# Patient Record
Sex: Male | Born: 1990 | Race: Black or African American | Hispanic: No | Marital: Single | Smoking: Never smoker
Health system: Southern US, Community
[De-identification: ages and names within clinical notes are randomized; demographics above are authoritative.]

---

## 2020-08-04 ENCOUNTER — Encounter (HOSPITAL_COMMUNITY): Payer: Self-pay

## 2020-08-04 ENCOUNTER — Other Ambulatory Visit: Payer: Self-pay

## 2020-08-04 ENCOUNTER — Emergency Department (HOSPITAL_COMMUNITY): Payer: Medicaid Other

## 2020-08-04 ENCOUNTER — Emergency Department (HOSPITAL_COMMUNITY)
Admission: EM | Admit: 2020-08-04 | Discharge: 2020-08-04 | Disposition: A | Discharge status: Home / Self Care | Payer: Medicaid Other | Attending: Emergency Medicine | Admitting: Emergency Medicine

## 2020-08-04 DIAGNOSIS — M25561 Pain in right knee: Secondary | ICD-10-CM | POA: Insufficient documentation

## 2020-08-04 DIAGNOSIS — Y999 Unspecified external cause status: Secondary | ICD-10-CM | POA: Diagnosis not present

## 2020-08-04 DIAGNOSIS — Y92310 Basketball court as the place of occurrence of the external cause: Secondary | ICD-10-CM | POA: Insufficient documentation

## 2020-08-04 DIAGNOSIS — W2105XA Struck by basketball, initial encounter: Secondary | ICD-10-CM | POA: Insufficient documentation

## 2020-08-04 DIAGNOSIS — Y9367 Activity, basketball: Secondary | ICD-10-CM | POA: Diagnosis not present

## 2020-08-04 MED ORDER — HYDROCODONE-ACETAMINOPHEN 5-325 MG PO TABS
1.0000 | ORAL_TABLET | Freq: Once | ORAL | Status: AC
  Administered 2020-08-04: 1 via ORAL
  Filled 2020-08-04: qty 1

## 2020-08-04 MED ORDER — HYDROCODONE-ACETAMINOPHEN 5-325 MG PO TABS: 1.0000 | ORAL_TABLET | ORAL | 0 refills | Status: DC | PRN

## 2020-08-04 NOTE — ED Provider Notes (Signed)
Antonio Hurst Provider Note   CSN: [id number] Arrival date & time: 08/04/20  2147    History Chief Complaint  Patient presents with  . Knee Pain    Antonio Hurst is a 29 y.o. male with this a past medical history who presents for evaluation of right knee pain.  Patient states he was playing basketball when he twisted at his right knee.  Patient states he felt a pop sensation and pain to his right lateral knee.  Does not extend into tibia, fibula, femur.  Has been unable to walk since the pain.  Has not take anything for this.  Rates his pain a 10/10.  Has noticed some mild swelling to the right aspect of knee.  No overlying erythema or warmth.  Denies falling lower extremity.  No paresthesias.  Denies fever, chills, nausea, vomiting, chest pain, shortness of breath abdominal pain, diarrhea, dysuria.  Denies aggravating or relieving factors.  History obtained from patient and past medical records. No interpretor was used.  Not currently followed by Ortho  HPI     History reviewed. No pertinent past medical history.  There are no problems to display for this patient.   History reviewed     History reviewed. No pertinent family history.  Social History   Tobacco Use  . Smoking status: Not on file  Substance Use Topics  . Alcohol use: Not on file  . Drug use: Not on file    Home Medications Prior to Admission medications   Medication Sig Start Date End Date Taking? Authorizing Provider  HYDROcodone-acetaminophen (NORCO/VICODIN) 5-325 MG tablet Take 1 tablet by mouth every 4 (four) hours as needed. 08/04/20   Antonio Hurst A, PA-C    Allergies    Patient has no known allergies.  Review of Systems   Review of Systems  Constitutional: Negative.   HENT: Negative.   Respiratory: Negative.   Cardiovascular: Negative.   Gastrointestinal: Negative.   Genitourinary: Negative.   Musculoskeletal: Positive for gait problem. Negative for  arthralgias, back pain, joint swelling, myalgias, neck pain and neck stiffness.       Right knee pain  Skin: Negative.   Neurological: Negative for dizziness, weakness, light-headedness and numbness.  All other systems reviewed and are negative.  Physical Exam Updated Vital Signs BP 131/79   Pulse 85   Temp 99.5 F (37.5 C) (Oral)   Resp 16   SpO2 98%   Physical Exam Vitals and nursing note reviewed.  Constitutional:      General: He is not in acute distress.    Appearance: He is well-developed. He is not ill-appearing, toxic-appearing or diaphoretic.  HENT:     Head: Atraumatic.  Eyes:     Pupils: Pupils are equal, round, and reactive to light.  Cardiovascular:     Rate and Rhythm: Normal rate and regular rhythm.  Pulmonary:     Effort: Pulmonary effort is normal. No respiratory distress.  Abdominal:     General: There is no distension.     Palpations: Abdomen is soft.  Musculoskeletal:     Cervical back: Normal range of motion and neck supple.     Thoracic back: Normal.     Lumbar back: Normal.     Right hip: Normal.     Left hip: Normal.     Right upper leg: Normal.     Left upper leg: Normal.     Right knee: Decreased range of motion. Tenderness present over the lateral joint line  and LCL. No patellar tendon tenderness. LCL laxity present.     Left knee: Normal.     Right lower leg: Normal.     Left lower leg: Normal.     Right ankle: Normal.     Left ankle: Normal.       Legs:     Comments: Tenderness palpation to right lateral knee over lateral joint line.  Is able to flex and extend however with pain.  Has mild joint effusion to right knee.  No bony tenderness to femur, tibia or fibula.  Wiggles toes without difficulty.  No shortening or rotation of legs.  Skin:    General: Skin is warm and dry.     Capillary Refill: Capillary refill takes less than 2 seconds.     Comments: No erythema, warmth.  No fluctuance or induration.  No overlying erythema or warmth    Neurological:     General: No focal deficit present.     Mental Status: He is alert.     Sensory: Sensation is intact.     Motor: Motor function is intact.     Coordination: Coordination is intact.     Comments: Unable to ambulate secondary to right knee pain.    ED Results / Procedures / Treatments   Labs (all labs ordered are listed, but only abnormal results are displayed) Labs Reviewed - No data to display  EKG None  Radiology DG Knee Complete 4 Views Right  Result Date: 08/04/2020 CLINICAL DATA:  Status post trauma. EXAM: RIGHT KNEE - COMPLETE 4+ VIEW COMPARISON:  None. FINDINGS: No evidence of acute fracture or dislocation. No evidence of arthropathy or other focal bone abnormality. A small to moderate sized joint effusion is noted. IMPRESSION: 1. No acute fracture or dislocation. 2. Small to moderate sized joint effusion. Electronically Signed   By: Aram Candela M.D.   On: 08/04/2020 22:31   Procedures Procedures (including critical care time)  Medications Ordered in ED Medications  HYDROcodone-acetaminophen (NORCO/VICODIN) 5-325 MG per tablet 1 tablet (1 tablet Oral Given 08/04/20 2257)   ED Course  I have reviewed the triage vital signs and the nursing notes.  Pertinent labs & imaging results that were available during my care of the patient were reviewed by me and considered in my medical decision making (see chart for details).  29 year old presents for evaluation of right knee pain. Twisted right knee pain felt a pop to the right lateral aspect of his knee.  Has been able to ambulate since then.  Has some tenderness to his lateral joint line with some mild swelling.  No bony tenderness of bilateral femur, tibia or fibula.  He is neurovascularly intact.  X-ray with small joint effusion however no fracture, dislocation.  Suspect ligament injury.  Discussed brace, crutches, RICE for symptomatic management and follow-up outpatient with orthopedics.  Low suspicion for  septic joint, gout, hemarthrosis, acute fracture, dislocation.   The patient has been appropriately medically screened and/or stabilized in the ED. I have low suspicion for any other emergent medical condition which would require further screening, evaluation or treatment in the ED or require inpatient management.  Patient is hemodynamically stable and in no acute distress.  Patient able to ambulate in department prior to ED.  Evaluation does not show acute pathology that would require ongoing or additional emergent interventions while in the emergency department or further inpatient treatment.  I have discussed the diagnosis with the patient and answered all questions.  Pain is been managed  while in the emergency department and patient has no further complaints prior to discharge.  Patient is comfortable with plan discussed in room and is stable for discharge at this time.  I have discussed strict return precautions for returning to the emergency department.  Patient was encouraged to follow-up with PCP/specialist refer to at discharge.    MDM Rules/Calculators/A&P                           Final Clinical Impression(s) / ED Diagnoses Final diagnoses:  Acute pain of right knee    Rx / DC Orders ED Discharge Orders         Ordered    HYDROcodone-acetaminophen (NORCO/VICODIN) 5-325 MG tablet  Every 4 hours PRN     Discontinue  Reprint     08/04/20 2325           Alexiah Koroma A, PA-C 08/04/20 2344    Alvira Monday, MD 08/05/20 1612

## 2020-08-04 NOTE — Discharge Instructions (Signed)
Follow-up with orthopedics.  I have prescribed you pain medicine to a 24-hour pharmacy that is local.  Take as needed for knee pain.  Do not drive or operate heavy machinery while taking this medication as this may make you sleepy.  This medication may be, addictive.  May also take ibuprofen.  Sure to ice and elevate your knee.  Return for new or worsening symptoms.

## 2020-08-04 NOTE — ED Triage Notes (Signed)
Pt reports feeling a pop in his R lateral knee around 730p when playing basketball. No fall. States that he cant move that knee or bear weight. Pain 10/10.

## 2020-08-18 ENCOUNTER — Ambulatory Visit: Payer: Medicaid Other | Admitting: Physician Assistant

## 2021-03-13 ENCOUNTER — Emergency Department (HOSPITAL_COMMUNITY): Payer: Medicaid Other

## 2021-03-13 ENCOUNTER — Emergency Department (HOSPITAL_COMMUNITY)
Admission: EM | Admit: 2021-03-13 | Discharge: 2021-03-13 | Disposition: A | Discharge status: Home / Self Care | Payer: Medicaid Other | Attending: Emergency Medicine | Admitting: Emergency Medicine

## 2021-03-13 ENCOUNTER — Encounter (HOSPITAL_COMMUNITY): Payer: Self-pay | Admitting: Emergency Medicine

## 2021-03-13 DIAGNOSIS — R102 Pelvic and perineal pain: Secondary | ICD-10-CM | POA: Diagnosis present

## 2021-03-13 DIAGNOSIS — N200 Calculus of kidney: Secondary | ICD-10-CM | POA: Insufficient documentation

## 2021-03-13 LAB — CBC WITH DIFFERENTIAL/PLATELET
Abs Immature Granulocytes: 0.01 10*3/uL (ref 0.00–0.07)
Basophils Absolute: 0.1 10*3/uL (ref 0.0–0.1)
Basophils Relative: 1 %
Eosinophils Absolute: 0.2 10*3/uL (ref 0.0–0.5)
Eosinophils Relative: 3 %
HCT: 43.4 % (ref 39.0–52.0)
Hemoglobin: 14.2 g/dL (ref 13.0–17.0)
Immature Granulocytes: 0 %
Lymphocytes Relative: 37 %
Lymphs Abs: 2.7 10*3/uL (ref 0.7–4.0)
MCH: 27 pg (ref 26.0–34.0)
MCHC: 32.7 g/dL (ref 30.0–36.0)
MCV: 82.7 fL (ref 80.0–100.0)
Monocytes Absolute: 0.5 10*3/uL (ref 0.1–1.0)
Monocytes Relative: 7 %
Neutro Abs: 3.8 10*3/uL (ref 1.7–7.7)
Neutrophils Relative %: 52 %
Platelets: 317 10*3/uL (ref 150–400)
RBC: 5.25 MIL/uL (ref 4.22–5.81)
RDW: 13.6 % (ref 11.5–15.5)
WBC: 7.3 10*3/uL (ref 4.0–10.5)
nRBC: 0 % (ref 0.0–0.2)

## 2021-03-13 LAB — COMPREHENSIVE METABOLIC PANEL
ALT: 43 U/L (ref 0–44)
AST: 30 U/L (ref 15–41)
Albumin: 3.7 g/dL (ref 3.5–5.0)
Alkaline Phosphatase: 90 U/L (ref 38–126)
Anion gap: 5 (ref 5–15)
BUN: 12 mg/dL (ref 6–20)
CO2: 25 mmol/L (ref 22–32)
Calcium: 9.3 mg/dL (ref 8.9–10.3)
Chloride: 111 mmol/L (ref 98–111)
Creatinine, Ser: 1.28 mg/dL — ABNORMAL HIGH (ref 0.61–1.24)
GFR, Estimated: >60 mL/min (ref >60)
Glucose, Bld: 101 mg/dL — ABNORMAL HIGH (ref 70–99)
Potassium: 4.2 mmol/L (ref 3.5–5.1)
Sodium: 141 mmol/L (ref 135–145)
Total Bilirubin: 0.5 mg/dL (ref 0.3–1.2)
Total Protein: 6.7 g/dL (ref 6.5–8.1)

## 2021-03-13 LAB — URINALYSIS, ROUTINE W REFLEX MICROSCOPIC
Bacteria, UA: NONE SEEN
Bilirubin Urine: NEGATIVE
Glucose, UA: NEGATIVE mg/dL
Ketones, ur: NEGATIVE mg/dL
Leukocytes,Ua: NEGATIVE
Nitrite: NEGATIVE
Protein, ur: NEGATIVE mg/dL
RBC / HPF: >50 RBC/hpf — ABNORMAL HIGH (ref 0–5)
Specific Gravity, Urine: 1.01 (ref 1.005–1.030)
pH: 6 (ref 5.0–8.0)

## 2021-03-13 MED ORDER — NAPROXEN 375 MG PO TABS: 375.0000 mg | ORAL_TABLET | Freq: Two times a day (BID) | ORAL | 0 refills | Status: AC | PRN

## 2021-03-13 NOTE — Discharge Instructions (Addendum)
-  Your kidney function was slightly elevated today.  Normal is 1 and yours is 1.28.  You will need to follow-up with your primary care doctor to have this rechecked in 1 week with a blood test.  It is important that you stay well-hydrated and increase your fluid intake to help get your kidney function back to normal.  -CT scan shows you have a kidney stone in your right kidney that should not be the cause of your pain.  The stone that was seen in the bladder is likely the one that was causing your pain.  It should pass in the next couple of days.  -Prescription sent to pharmacy for naproxen.  This is an anti-inflammatory medicine.  Take it with food so it does not upset your stomach.  Do not take any additional Aleve, Motrin, ibuprofen for the same time as this medicine from the same family.  You can continue to take Tylenol for pain.  Follow-up with community clinic as needed.

## 2021-03-13 NOTE — ED Triage Notes (Signed)
Reports R sided pelvic pain that radiates to back since yesterday.  Denies nausea, vomiting, diarrhea, and urinary complaints.  No change with movement.

## 2021-03-13 NOTE — ED Provider Notes (Signed)
[name] EMERGENCY DEPARTMENT Provider Note   CSN: [id number] Arrival date & time: 03/13/21  1543     History Chief Complaint  Patient presents with   Pelvic Pain   Back Pain    Antonio Hurst is a 30 y.o. male with past medical history of kidney stones.  HPI Patient presents to emergency room today with chief complaint of right-sided pelvic and back pain x1 day.  He states the pain started last night.  It woke him up several times throughout the night.  He describes as an aching pain.  It started in his back and radiates to his groin.  He has pain in his right testicle as well.  He states the pain has been constant, waxes and wanes in severity.  He rates the pain currently 6 out of 10 in severity however it has been as high as 10 out of 10.  He denies any associated nausea or emesis.  He took Tylenol with minimal symptom improvement.  He denies any fever, chills, gross hematuria, urinary frequency, penile discharge, penile or scrotal swelling.  He is not concerned for STIs.  He states he had a kidney stone several years ago and this feels similar.  Denies any recent fall, trauma or injury to his back.    History reviewed. No pertinent past medical history.  There are no problems to display for this patient.   History reviewed. No pertinent surgical history.     No family history on file.  Social History   Tobacco Use   Smoking status: Never Smoker   Smokeless tobacco: Never Used  Substance Use Topics   Alcohol use: Not Currently   Drug use: Not Currently    Home Medications Prior to Admission medications   Medication Sig Start Date End Date Taking? Authorizing Provider  naproxen (NAPROSYN) 375 MG tablet Take 1 tablet (375 mg total) by mouth 2 (two) times daily as needed for up to 7 days for mild pain. 03/13/21 03/20/21 Yes Walisiewicz, Kiko Ripp E, PA-C  HYDROcodone-acetaminophen (NORCO/VICODIN) 5-325 MG tablet Take 1 tablet by mouth every 4 (four)  hours as needed. 08/04/20   Henderly, Britni A, PA-C    Allergies    Patient has no known allergies.  Review of Systems   Review of Systems All other systems are reviewed and are negative for acute change except as noted in the HPI.  Physical Exam Updated Vital Signs BP 128/66 (BP Location: Left Arm)    Pulse 75    Temp 98.6 F (37 C)    Resp 18    SpO2 97%   Physical Exam Vitals and nursing note reviewed.  Constitutional:      General: He is not in acute distress.    Appearance: He is not ill-appearing.  HENT:     Head: Normocephalic and atraumatic.     Right Ear: External ear normal.     Left Ear: External ear normal.     Nose: Nose normal.     Mouth/Throat:     Mouth: Mucous membranes are moist.     Pharynx: Oropharynx is clear.  Eyes:     General: No scleral icterus.       Right eye: No discharge.        Left eye: No discharge.     Extraocular Movements: Extraocular movements intact.     Conjunctiva/sclera: Conjunctivae normal.     Pupils: Pupils are equal, round, and reactive to light.  Neck:  Vascular: No JVD.  Cardiovascular:     Rate and Rhythm: Normal rate and regular rhythm.     Pulses: Normal pulses.          Radial pulses are 2+ on the right side and 2+ on the left side.     Heart sounds: Normal heart sounds.  Pulmonary:     Comments: Lungs clear to auscultation in all fields. Symmetric chest rise. No wheezing, rales, or rhonchi. Abdominal:     Comments: Abdomen is soft, non-distended.  Tender to palpation of right flank.  No peritoneal signs.  No abdominal tenderness appreciated. No rigidity, no guarding. No peritoneal signs.  Genitourinary:    Comments: Chaperone present for exam. No discharge or urethritis noted. No signs of sores or lesions or erythema on the penis or testicles. The penis and testicles are nontender. No testicular masses or swelling. No scrotal swelling. No signs of any inguinal hernias. Cremaster reflex present  bilaterally.   Musculoskeletal:        General: Normal range of motion.     Cervical back: Normal range of motion.  Skin:    General: Skin is warm and dry.     Capillary Refill: Capillary refill takes less than 2 seconds.  Neurological:     Mental Status: He is oriented to person, place, and time.     GCS: GCS eye subscore is 4. GCS verbal subscore is 5. GCS motor subscore is 6.     Comments: Fluent speech, no facial droop.  Psychiatric:        Behavior: Behavior normal.     ED Results / Procedures / Treatments   Labs (all labs ordered are listed, but only abnormal results are displayed) Labs Reviewed  URINALYSIS, ROUTINE W REFLEX MICROSCOPIC - Abnormal; Notable for the following components:      Result Value   Color, Urine STRAW (*)    Hgb urine dipstick LARGE (*)    RBC / HPF >50 (*)    All other components within normal limits  COMPREHENSIVE METABOLIC PANEL - Abnormal; Notable for the following components:   Glucose, Bld 101 (*)    Creatinine, Ser 1.28 (*)    All other components within normal limits  CBC WITH DIFFERENTIAL/PLATELET    EKG None  Radiology CT Renal Stone Study  Result Date: 03/13/2021 CLINICAL DATA:  Flank pain.  Evaluate for kidney stone. EXAM: CT ABDOMEN AND PELVIS WITHOUT CONTRAST TECHNIQUE: Multidetector CT imaging of the abdomen and pelvis was performed following the standard protocol without IV contrast. COMPARISON:  None. FINDINGS: Lower chest: No acute abnormality. Hepatobiliary: No focal liver abnormality is seen. No gallstones, gallbladder wall thickening, or biliary dilatation. Pancreas: Unremarkable. No pancreatic ductal dilatation or surrounding inflammatory changes. Spleen: Normal in size without focal abnormality. Adrenals/Urinary Tract: Normal adrenal glands. Mild punctate stone within upper pole of right kidney is identified, image 73/6. No left renal calculi. Mild right hydroureter. No right ureteral calculi. Punctate stone is identified  within the posterior bladder base measuring 2-3 mm, image 66/7. Stomach/Bowel: Stomach appears normal. The appendix is visualized and appears normal. No bowel wall thickening, inflammation or distension. Vascular/Lymphatic: No significant vascular findings are present. No enlarged abdominal or pelvic lymph nodes. Reproductive: Prostate is unremarkable. Other: No abdominal wall hernia or abnormality. No abdominopelvic ascites. Musculoskeletal: No acute or significant osseous findings. Bilateral L5 pars defects. No significant anterolisthesis. IMPRESSION: 1. Mild right hydroureter. No ureteral calculi identified. Punctate stone is within the posterior bladder base measures 2-3 mm.  Electronically Signed   By: Signa Kell M.D.   On: 03/13/2021 18:54    Procedures Procedures   Medications Ordered in ED Medications - No data to display  ED Course  I have reviewed the triage vital signs and the nursing notes.  Pertinent labs & imaging results that were available during my care of the patient were reviewed by me and considered in my medical decision making (see chart for details).    MDM Rules/Calculators/A&P                          History provided by patient with additional history obtained from chart review.    Pt has been diagnosed with a stone seen in right kidney and in bladder. There is no evidence of significant hydronephrosis, serum creatine slightly elevated at 1.28.  No prior to compare.  Patient elects to drink p.o. fluids and set up to have IV fluids.  is reasonable.  Patient has declined need for any analgesics or antiemetics.  He does not have intractable vomiting.  Will discharge home with prescription for naproxen.  Recommended he follow-up with PCP within 1 week to have creatinine rechecked.  Discussed the importance of fluid intake at home.  The patient appears reasonably screened and/or stabilized for discharge and I doubt any other medical condition or other [facility] requiring  further screening, evaluation, or treatment in the ED at this time prior to discharge. The patient is safe for discharge with strict return precautions discussed.    Portions of this note were generated with Scientist, clinical (histocompatibility and immunogenetics). Dictation errors may occur despite best attempts at proofreading.    Final Clinical Impression(s) / ED Diagnoses Final diagnoses:  Kidney stone    Rx / DC Orders ED Discharge Orders         Ordered    naproxen (NAPROSYN) 375 MG tablet  2 times daily PRN        03/13/21 1942           Shanon Ace, PA-C 03/13/21 1947    Mancel Bale, MD 03/13/21 2046

## 2021-03-21 ENCOUNTER — Emergency Department (HOSPITAL_COMMUNITY)
Admission: EM | Admit: 2021-03-21 | Discharge: 2021-03-21 | Disposition: A | Discharge status: Home / Self Care | Payer: Medicaid Other | Attending: Emergency Medicine | Admitting: Emergency Medicine

## 2021-03-21 ENCOUNTER — Other Ambulatory Visit: Payer: Self-pay

## 2021-03-21 ENCOUNTER — Encounter (HOSPITAL_COMMUNITY): Payer: Self-pay | Admitting: Emergency Medicine

## 2021-03-21 DIAGNOSIS — R1030 Lower abdominal pain, unspecified: Secondary | ICD-10-CM

## 2021-03-21 DIAGNOSIS — R350 Frequency of micturition: Secondary | ICD-10-CM | POA: Diagnosis not present

## 2021-03-21 DIAGNOSIS — R1031 Right lower quadrant pain: Secondary | ICD-10-CM | POA: Insufficient documentation

## 2021-03-21 LAB — CBC
HCT: 46.3 % (ref 39.0–52.0)
Hemoglobin: 14.6 g/dL (ref 13.0–17.0)
MCH: 26.7 pg (ref 26.0–34.0)
MCHC: 31.5 g/dL (ref 30.0–36.0)
MCV: 84.8 fL (ref 80.0–100.0)
Platelets: 353 10*3/uL (ref 150–400)
RBC: 5.46 MIL/uL (ref 4.22–5.81)
RDW: 13.5 % (ref 11.5–15.5)
WBC: 6.8 10*3/uL (ref 4.0–10.5)
nRBC: 0 % (ref 0.0–0.2)

## 2021-03-21 LAB — COMPREHENSIVE METABOLIC PANEL
ALT: 39 U/L (ref 0–44)
AST: 30 U/L (ref 15–41)
Albumin: 4 g/dL (ref 3.5–5.0)
Alkaline Phosphatase: 88 U/L (ref 38–126)
Anion gap: 10 (ref 5–15)
BUN: 15 mg/dL (ref 6–20)
CO2: 27 mmol/L (ref 22–32)
Calcium: 9.6 mg/dL (ref 8.9–10.3)
Chloride: 102 mmol/L (ref 98–111)
Creatinine, Ser: 1.24 mg/dL (ref 0.61–1.24)
GFR, Estimated: >60 mL/min (ref >60)
Glucose, Bld: 103 mg/dL — ABNORMAL HIGH (ref 70–99)
Potassium: 4 mmol/L (ref 3.5–5.1)
Sodium: 139 mmol/L (ref 135–145)
Total Bilirubin: 0.6 mg/dL (ref 0.3–1.2)
Total Protein: 7.3 g/dL (ref 6.5–8.1)

## 2021-03-21 LAB — URINALYSIS, ROUTINE W REFLEX MICROSCOPIC
Bilirubin Urine: NEGATIVE
Glucose, UA: NEGATIVE mg/dL
Hgb urine dipstick: NEGATIVE
Ketones, ur: NEGATIVE mg/dL
Nitrite: NEGATIVE
Protein, ur: NEGATIVE mg/dL
Specific Gravity, Urine: 1.014 (ref 1.005–1.030)
pH: 5 (ref 5.0–8.0)

## 2021-03-21 MED ORDER — KETOROLAC TROMETHAMINE 15 MG/ML IJ SOLN
15.0000 mg | Freq: Once | INTRAMUSCULAR | Status: AC
  Administered 2021-03-21: 15 mg via INTRAVENOUS
  Filled 2021-03-21: qty 1

## 2021-03-21 MED ORDER — CEPHALEXIN 500 MG PO CAPS: 500.0000 mg | ORAL_CAPSULE | Freq: Three times a day (TID) | ORAL | 0 refills | Status: AC

## 2021-03-21 MED ORDER — NAPROXEN 500 MG PO TABS: 500.0000 mg | ORAL_TABLET | Freq: Two times a day (BID) | ORAL | 0 refills | Status: DC

## 2021-03-21 NOTE — ED Provider Notes (Signed)
[facility] EMERGENCY DEPARTMENT Provider Note   CSN: [id number] Arrival date & time: 03/21/21  1056     History No chief complaint on file.   Antonio Hurst is a 30 y.o. male presenting for evaluation of lower abdominal pain.  Patient states he was seen last week, diagnosed with a recently passed kidney stone.  Initially his pain was improved, however over the past 1 days, pain has worsened.  It is in his lower abdomen, worse on the right side.  It changes slightly with urination, but he cannot state where he gets better or worse.  He is not taking anything for pain including the naproxen that was prescribed, as he states pharmacy never got it.  He denies fevers, chills, chest pain, shortness of breath, cough, nausea, vomiting, upper abdominal pain, or abnormal bowel movements.  He reports urinary frequency but not much comes out.  He has no other medical problems, takes no medications daily.  Additional history obtained from chart review.  Reviewed patient's recent ED visit including labs and CT.  HPI     History reviewed. No pertinent past medical history.  There are no problems to display for this patient.   History reviewed. No pertinent surgical history.     History reviewed. No pertinent family history.  Social History   Tobacco Use  . Smoking status: Never Smoker  . Smokeless tobacco: Never Used  Substance Use Topics  . Alcohol use: Not Currently  . Drug use: Not Currently    Home Medications Prior to Admission medications   Medication Sig Start Date End Date Taking? Authorizing Provider  cephALEXin (KEFLEX) 500 MG capsule Take 1 capsule (500 mg total) by mouth 3 (three) times daily for 7 days. 03/21/21 03/28/21 Yes Tykeem Lanzer, PA-C  naproxen (NAPROSYN) 500 MG tablet Take 1 tablet (500 mg total) by mouth 2 (two) times daily with a meal. 03/21/21  Yes Riggins Cisek, PA-C  HYDROcodone-acetaminophen (NORCO/VICODIN) 5-325 MG tablet Take 1 tablet  by mouth every 4 (four) hours as needed. 08/04/20   Henderly, Britni A, PA-C    Allergies    Patient has no known allergies.  Review of Systems   Review of Systems  Gastrointestinal: Positive for abdominal pain.  Genitourinary: Positive for frequency.  All other systems reviewed and are negative.   Physical Exam Updated Vital Signs BP 128/81 (BP Location: Right Arm)   Pulse 96   Temp 98.3 F (36.8 C) (Oral)   Resp 19   SpO2 98%   Physical Exam Vitals and nursing note reviewed.  Constitutional:      General: He is not in acute distress.    Appearance: He is well-developed.     Comments: Resting in the bed in NAD  HENT:     Head: Normocephalic and atraumatic.  Eyes:     Extraocular Movements: Extraocular movements intact.     Conjunctiva/sclera: Conjunctivae normal.     Pupils: Pupils are equal, round, and reactive to light.  Cardiovascular:     Rate and Rhythm: Normal rate and regular rhythm.     Pulses: Normal pulses.  Pulmonary:     Effort: Pulmonary effort is normal. No respiratory distress.     Breath sounds: Normal breath sounds. No wheezing.  Abdominal:     General: There is no distension.     Palpations: Abdomen is soft. There is no mass.     Tenderness: There is no abdominal tenderness. There is no guarding or rebound.  Comments: Mild ttp of suprapubic and r lower abd. No cva tenderness.  No rigidity, guarding, distention.  Negative rebound.  No peritonitis  Musculoskeletal:        General: Normal range of motion.     Cervical back: Normal range of motion and neck supple.  Skin:    General: Skin is warm and dry.     Capillary Refill: Capillary refill takes less than 2 seconds.  Neurological:     Mental Status: He is alert and oriented to person, place, and time.     ED Results / Procedures / Treatments   Labs (all labs ordered are listed, but only abnormal results are displayed) Labs Reviewed  URINALYSIS, ROUTINE W REFLEX MICROSCOPIC - Abnormal;  Notable for the following components:      Result Value   Leukocytes,Ua TRACE (*)    Bacteria, UA RARE (*)    All other components within normal limits  COMPREHENSIVE METABOLIC PANEL - Abnormal; Notable for the following components:   Glucose, Bld 103 (*)    All other components within normal limits  URINE CULTURE  CBC    EKG None  Radiology No results found.  Procedures Procedures   Medications Ordered in ED Medications  ketorolac (TORADOL) 15 MG/ML injection 15 mg (15 mg Intravenous Given 03/21/21 1151)    ED Course  I have reviewed the triage vital signs and the nursing notes.  Pertinent labs & imaging results that were available during my care of the patient were reviewed by me and considered in my medical decision making (see chart for details).    MDM Rules/Calculators/A&P                          Patient presented for evaluation of lower abdominal pain.  On exam, patient appears nontoxic.  He does have mild tenderness palpation of suprapubic and right lower abdomen.  However overall does not appear significantly uncomfortable.  Will obtain labs as patient had mildly elevated creatinine last time to ensure no leukocytosis or significant change.  Will obtain urine, if no infection or blood, he likely will not need repeat CT scan.  Consider MSK cause.  Less likely appendicitis as patient is afebrile without nausea or vomiting and did not have appendicitis on his last CT scan.  Labs interpreted by me, overall reassuring.  Kidney function improved.  Urine without blood, patient is overall well-appearing, doubt recurring kidney stone.  There are no leukocytes, rare bacteria, and white cells, consider UTI.  On reevaluation, patient is sleeping comfortably.  As such, doubt intra-abdominal infection, I do not believe he needs repeat CT scan today.  I discussed treatment for UTI as patient has urinary symptoms with a questionable urine.  Culture sent.  Encourage follow-up with urology  symptoms not improving.  At this time, patient appears safe for discharge.  Return precautions given.  Patient states he understands and agrees to plan.  Final Clinical Impression(s) / ED Diagnoses Final diagnoses:  Lower abdominal pain    Rx / DC Orders ED Discharge Orders         Ordered    cephALEXin (KEFLEX) 500 MG capsule  3 times daily        03/21/21 1416    naproxen (NAPROSYN) 500 MG tablet  2 times daily with meals        03/21/21 1417           Rahmon Heigl, PA-C 03/21/21 1524  Terrilee Files, MD 03/21/21 1725

## 2021-03-21 NOTE — Discharge Instructions (Addendum)
Plan I am old lady take antibiotics as prescribed.  Take the entire course, even if symptoms improve. Take naproxen 2 times a day with meals.  Do not take other anti-inflammatories at the same time (Advil, Motrin, ibuprofen, Aleve). You may supplement with Tylenol if you need further pain control. Use ice packs or heating pads if this helps control your pain. Follow-up with your urologist listed below if symptoms not improving. Return to the emergency room if you develop high fevers, persistent vomiting, severe worsening pain, inability to urinate, or any new, worsening, or concerning symptoms

## 2021-03-21 NOTE — ED Triage Notes (Signed)
Pt back to the Ed with c/o right flank pain , pt was here on the 27th with same complaint , has been unable to follow up with his pcp

## 2021-03-22 LAB — URINE CULTURE: Culture: NO GROWTH

## 2021-04-19 ENCOUNTER — Ambulatory Visit (HOSPITAL_BASED_OUTPATIENT_CLINIC_OR_DEPARTMENT_OTHER): Payer: Medicaid Other | Admitting: Family Medicine

## 2021-06-05 ENCOUNTER — Emergency Department (HOSPITAL_COMMUNITY)
Admission: EM | Admit: 2021-06-05 | Discharge: 2021-06-06 | Disposition: A | Discharge status: Home / Self Care | Payer: Medicaid Other | Attending: Emergency Medicine | Admitting: Emergency Medicine

## 2021-06-05 DIAGNOSIS — Z20822 Contact with and (suspected) exposure to covid-19: Secondary | ICD-10-CM | POA: Diagnosis not present

## 2021-06-05 DIAGNOSIS — J029 Acute pharyngitis, unspecified: Secondary | ICD-10-CM | POA: Insufficient documentation

## 2021-06-05 DIAGNOSIS — Z5321 Procedure and treatment not carried out due to patient leaving prior to being seen by health care provider: Secondary | ICD-10-CM | POA: Insufficient documentation

## 2021-06-06 ENCOUNTER — Other Ambulatory Visit: Payer: Self-pay

## 2021-06-06 ENCOUNTER — Encounter (HOSPITAL_COMMUNITY): Payer: Self-pay

## 2021-06-06 LAB — RESP PANEL BY RT-PCR (FLU A&B, COVID) ARPGX2
Influenza A by PCR: NEGATIVE
Influenza B by PCR: NEGATIVE
SARS Coronavirus 2 by RT PCR: NEGATIVE

## 2021-06-06 MED ORDER — ACETAMINOPHEN 500 MG PO TABS
1000.0000 mg | ORAL_TABLET | Freq: Once | ORAL | Status: AC
  Administered 2021-06-06: 03:00:00 1000 mg via ORAL

## 2021-06-06 NOTE — ED Notes (Signed)
Pt not responding for vital check. 

## 2021-06-06 NOTE — ED Provider Notes (Signed)
Emergency Medicine Provider Triage Evaluation Note  Xzayvion Vaeth , a 30 y.o. male  was evaluated in triage.  Pt complains of sore throat, headache, generalized body aches.  Has tried taking DayQuil with some relief.  Denies known sick contacts.  Denies productive cough.  Denies shortness of breath..  Review of Systems  Positive: Sore throat, body aches, headache Negative: Fever, chills  Physical Exam  BP 116/78 (BP Location: Left Arm)   Pulse 73   Temp 98.2 F (36.8 C) (Oral)   Resp 18   SpO2 98%  Gen:   Awake, no distress   Resp:  Normal effort  MSK:   Moves extremities without difficulty  Other:    Medical Decision Making  Medically screening exam initiated at 12:02 AM.  Appropriate orders placed.  Cathlean Sauer was informed that the remainder of the evaluation will be completed by another provider, this initial triage assessment does not replace that evaluation, and the importance of remaining in the ED until their evaluation is complete.  Suspected COVID   Roxy Horseman, PA-C 06/06/21 Kevin Fenton, April, MD 06/06/21 [id number]

## 2021-06-06 NOTE — ED Notes (Signed)
Pt not answering for vital recheck  

## 2021-06-06 NOTE — ED Triage Notes (Signed)
Patient reports sore throat, bodyaches and headache starting this morning, denies any sick contacts, no vaccinated against covid

## 2021-08-13 ENCOUNTER — Encounter (HOSPITAL_COMMUNITY): Payer: Self-pay

## 2021-08-13 ENCOUNTER — Emergency Department (HOSPITAL_BASED_OUTPATIENT_CLINIC_OR_DEPARTMENT_OTHER): Payer: Medicaid Other

## 2021-08-13 ENCOUNTER — Emergency Department (HOSPITAL_COMMUNITY): Payer: Medicaid Other

## 2021-08-13 ENCOUNTER — Other Ambulatory Visit: Payer: Self-pay

## 2021-08-13 ENCOUNTER — Emergency Department (HOSPITAL_COMMUNITY)
Admission: EM | Admit: 2021-08-13 | Discharge: 2021-08-13 | Disposition: A | Discharge status: Home / Self Care | Payer: Medicaid Other | Attending: Emergency Medicine | Admitting: Emergency Medicine

## 2021-08-13 DIAGNOSIS — M79604 Pain in right leg: Secondary | ICD-10-CM | POA: Diagnosis not present

## 2021-08-13 DIAGNOSIS — M79671 Pain in right foot: Secondary | ICD-10-CM | POA: Diagnosis not present

## 2021-08-13 DIAGNOSIS — M7989 Other specified soft tissue disorders: Secondary | ICD-10-CM | POA: Diagnosis not present

## 2021-08-13 MED ORDER — HYDROCODONE-ACETAMINOPHEN 5-325 MG PO TABS
1.0000 | ORAL_TABLET | Freq: Once | ORAL | Status: AC
  Administered 2021-08-13: 1 via ORAL
  Filled 2021-08-13: qty 1

## 2021-08-13 MED ORDER — CELECOXIB 200 MG PO CAPS: 200.0000 mg | ORAL_CAPSULE | Freq: Two times a day (BID) | ORAL | 0 refills | Status: AC

## 2021-08-13 MED ORDER — ONDANSETRON 4 MG PO TBDP
4.0000 mg | ORAL_TABLET | Freq: Once | ORAL | Status: AC
  Administered 2021-08-13: 4 mg via ORAL
  Filled 2021-08-13: qty 1

## 2021-08-13 NOTE — ED Notes (Signed)
Pt educated on the effects of the pain medication administered and possibility of making him feel drowsy and importance of not driving. Pt verbalized understanding and stated "I have someone that can come get me."

## 2021-08-13 NOTE — ED Provider Notes (Signed)
[facility]-EMERGENCY DEPT Provider Note   CSN: [id number] Arrival date & time: 08/13/21  1006     History Chief Complaint  Patient presents with   Foot Pain    Antonio Hurst is a 30 y.o. male who presents emergency department with a chief complaint of right foot pain.  Patient states that he developed pain which has been progressively worsening over the past few days on the lateral side of the foot.  He states that he has pain, swelling and erythema on the lateral aspect of the foot and ankle.  He has no known injuries.  He has pain with wiggling the toes and with any movement of the ankle.  He denies chest pain, shortness of breath, numbness or tingling.   Foot Pain      History reviewed. No pertinent past medical history.  There are no problems to display for this patient.   History reviewed. No pertinent surgical history.     History reviewed. No pertinent family history.  Social History   Tobacco Use   Smoking status: Never   Smokeless tobacco: Never  Substance Use Topics   Alcohol use: Not Currently   Drug use: Not Currently    Home Medications Prior to Admission medications   Medication Sig Start Date End Date Taking? Authorizing Provider  HYDROcodone-acetaminophen (NORCO/VICODIN) 5-325 MG tablet Take 1 tablet by mouth every 4 (four) hours as needed. 08/04/20   Henderly, Britni A, PA-C  naproxen (NAPROSYN) 500 MG tablet Take 1 tablet (500 mg total) by mouth 2 (two) times daily with a meal. 03/21/21   Caccavale, Sophia, PA-C    Allergies    Patient has no known allergies.  Review of Systems   Review of Systems  Constitutional:  Negative for chills and fever.  Musculoskeletal:  Positive for gait problem and joint swelling.   Physical Exam Updated Vital Signs BP (!) 144/97 (BP Location: Left Arm)   Pulse 81   Temp 98.1 F (36.7 C) (Oral)   Resp 16   SpO2 97%   Physical Exam Vitals and nursing note reviewed.  Constitutional:       General: He is not in acute distress.    Appearance: He is well-developed. He is not diaphoretic.  HENT:     Head: Normocephalic and atraumatic.  Eyes:     General: No scleral icterus.    Conjunctiva/sclera: Conjunctivae normal.  Cardiovascular:     Rate and Rhythm: Normal rate and regular rhythm.     Heart sounds: Normal heart sounds.  Pulmonary:     Effort: Pulmonary effort is normal. No respiratory distress.     Breath sounds: Normal breath sounds.  Abdominal:     Palpations: Abdomen is soft.     Tenderness: There is no abdominal tenderness.  Musculoskeletal:     Cervical back: Normal range of motion and neck supple.     Comments: Swelling noted to the right ankle especially over the proximal lateral foot.  Exquisitely tender to palpation, pain with all movement of the ankle but especially with eversion.  Mild swelling of the left calf, mild erythema noted.  No calf tenderness  Skin:    General: Skin is warm and dry.  Neurological:     Mental Status: He is alert.  Psychiatric:        Behavior: Behavior normal.    ED Results / Procedures / Treatments   Labs (all labs ordered are listed, but only abnormal results are displayed) Labs Reviewed -  No data to display  EKG None  Radiology DG Foot Complete Right  Result Date: 08/13/2021 CLINICAL DATA:  Acute right foot pain without known injury. EXAM: RIGHT FOOT COMPLETE - 3+ VIEW COMPARISON:  None. FINDINGS: There is no evidence of fracture or dislocation. There is no evidence of arthropathy or other focal bone abnormality. Soft tissues are unremarkable. IMPRESSION: Negative. Electronically Signed   By: Lupita Raider M.D.   On: 08/13/2021 12:25    Procedures Procedures   Medications Ordered in ED Medications  HYDROcodone-acetaminophen (NORCO/VICODIN) 5-325 MG per tablet 1 tablet (1 tablet Oral Given 08/13/21 1132)  ondansetron (ZOFRAN-ODT) disintegrating tablet 4 mg (4 mg Oral Given 08/13/21 1132)    ED Course  I have  reviewed the triage vital signs and the nursing notes.  Pertinent labs & imaging results that were available during my care of the patient were reviewed by me and considered in my medical decision making (see chart for details).    MDM Rules/Calculators/A&P                           Patient here with right foot pain and swelling.  No evidence of DVT, I ordered and reviewed images of a right ankle x-ray that showed no acute abnormalities.  Suspect tendinitis versus bursitis.  Will discharge with anti-inflammatory medications, RICE protocol, walking boot and crutches.  Follow-up with podiatry.  Discussed return precautions.  Patient appears otherwise appropriate for discharge at this time  Final Clinical Impression(s) / ED Diagnoses Final diagnoses:  Foot pain, right    Rx / DC Orders ED Discharge Orders     None        Arthor Captain, PA-C 08/13/21 1357    Virgina Norfolk, DO 08/13/21 1450

## 2021-08-13 NOTE — ED Notes (Signed)
Cam boot applied by ortho tech.

## 2021-08-13 NOTE — ED Triage Notes (Signed)
Pt endorses progressively worsening right foot pain that began a few days ago. Pt denies injury.

## 2021-08-13 NOTE — Discharge Instructions (Addendum)
Do not appear to have a DVT and your x-ray is negative for any obvious bony abnormalities.  This may be a tendinitis or a bursitis which is an inflammation of one of the fluid-filled sacs around the ankle.  It is important to ice your foot at least 5 times a day for 20 minutes at a time with ice and a bag over a towel and not directly against your skin.  Try to elevate your foot is much as possible above your heart and try to reduce the amount of pressure you are putting on the foot by using your crutches.  Please follow-up as soon as possible with Dr. Logan Bores or any one of the other doctors at [facility].

## 2021-08-13 NOTE — Progress Notes (Signed)
Lower extremity venous RT study completed.  Preliminary results relayed to provider in ED.   See CV Proc for preliminary results report.   Naomii Kreger, RDMS, RVT  

## 2021-08-18 ENCOUNTER — Ambulatory Visit (INDEPENDENT_AMBULATORY_CARE_PROVIDER_SITE_OTHER): Payer: Medicaid Other

## 2021-08-18 ENCOUNTER — Other Ambulatory Visit: Payer: Self-pay

## 2021-08-18 ENCOUNTER — Encounter: Payer: Self-pay | Admitting: Podiatry

## 2021-08-18 ENCOUNTER — Ambulatory Visit (INDEPENDENT_AMBULATORY_CARE_PROVIDER_SITE_OTHER): Payer: Medicaid Other | Admitting: Podiatry

## 2021-08-18 DIAGNOSIS — M7751 Other enthesopathy of right foot: Secondary | ICD-10-CM

## 2021-08-18 DIAGNOSIS — M10071 Idiopathic gout, right ankle and foot: Secondary | ICD-10-CM | POA: Diagnosis not present

## 2021-08-18 DIAGNOSIS — M778 Other enthesopathies, not elsewhere classified: Secondary | ICD-10-CM | POA: Diagnosis not present

## 2021-08-18 MED ORDER — METHYLPREDNISOLONE 4 MG PO TBPK: ORAL_TABLET | ORAL | 0 refills | Status: DC

## 2021-08-20 ENCOUNTER — Encounter: Payer: Self-pay | Admitting: Podiatry

## 2021-08-20 NOTE — Progress Notes (Signed)
  Subjective:  Patient ID: Antonio Hurst, male    DOB: 1991-02-18,  MRN: [id number] HPI Chief Complaint  Patient presents with   Foot Pain    Pt states he woke up last Thursday morning with his foot swollen unable to apply any pressure.  Was seen in ER on Saturday morning and was given a boot- no improvement Has very minor moment in foot- further evaluation     30 y.o. male presents with the above complaint.   ROS: Denies fever chills nausea vomiting muscle aches pains calf pain back pain chest pain shortness of breath.  No past medical history on file. No past surgical history on file.  Current Outpatient Medications:    methylPREDNISolone (MEDROL DOSEPAK) 4 MG TBPK tablet, 6 day dose pack - take as directed, Disp: 21 tablet, Rfl: 0   methylPREDNISolone (MEDROL DOSEPAK) 4 MG TBPK tablet, 6 day dose pack - take as directed, Disp: 21 tablet, Rfl: 0   celecoxib (CELEBREX) 200 MG capsule, Take 1 capsule (200 mg total) by mouth 2 (two) times daily., Disp: 20 capsule, Rfl: 0   HYDROcodone-acetaminophen (NORCO/VICODIN) 5-325 MG tablet, Take 1 tablet by mouth every 4 (four) hours as needed., Disp: 10 tablet, Rfl: 0  No Known Allergies Review of Systems Objective:  There were no vitals filed for this visit.  General: Well developed, nourished, in no acute distress, alert and oriented x3   Dermatological: Skin is warm, dry and supple bilateral. Nails x 10 are well maintained; remaining integument appears unremarkable at this time. There are no open sores, no preulcerative lesions, no rash or signs of infection present.  Vascular: Dorsalis Pedis artery and Posterior Tibial artery pedal pulses are 2/4 bilateral with immedate capillary fill time. Pedal hair growth present. No varicosities and no lower extremity edema present bilateral.   Neruologic: Grossly intact via light touch bilateral. Vibratory intact via tuning fork bilateral. Protective threshold with Semmes Wienstein monofilament intact  to all pedal sites bilateral. Patellar and Achilles deep tendon reflexes 2+ bilateral. No Babinski or clonus noted bilateral.   Musculoskeletal: No gross boney pedal deformities bilateral. No pain, crepitus, or limitation noted with foot and ankle range of motion bilateral. Muscular strength 5/5 in all groups tested bilateral.  Pain and warmth with mild erythema on palpation sinus tarsi right  Gait: Unassisted, Nonantalgic.    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  Plan: Injected the area today sinus tarsi 20 mg Kenalog 5 mg of Marcaine point of maximal tenderness.  Started him on methylprednisolone and I will follow-up with him in 1 week to make sure he is improving.     Karolina Zamor T. [place], [place]

## 2021-08-25 ENCOUNTER — Other Ambulatory Visit: Payer: Self-pay

## 2021-08-25 ENCOUNTER — Encounter: Payer: Self-pay | Admitting: Podiatry

## 2021-08-25 ENCOUNTER — Ambulatory Visit (INDEPENDENT_AMBULATORY_CARE_PROVIDER_SITE_OTHER): Payer: Medicaid Other | Admitting: Podiatry

## 2021-08-25 DIAGNOSIS — Q666 Other congenital valgus deformities of feet: Secondary | ICD-10-CM | POA: Diagnosis not present

## 2021-08-25 DIAGNOSIS — M722 Plantar fascial fibromatosis: Secondary | ICD-10-CM | POA: Diagnosis not present

## 2021-08-25 DIAGNOSIS — M7751 Other enthesopathy of right foot: Secondary | ICD-10-CM

## 2021-08-25 MED ORDER — INDOMETHACIN 50 MG PO CAPS: 50.0000 mg | ORAL_CAPSULE | Freq: Three times a day (TID) | ORAL | 1 refills | Status: AC

## 2021-08-25 NOTE — Progress Notes (Signed)
He presents today for follow-up of his gout.  He states that outside is feeling better but is having pain along the medial longitudinal arch.  He says it feels of some stiffness laterally.  But states that the spot I injected felt much better.  Objective: Vitals are stable alert and oriented x3.  Pulses are palpable.  He has some mild reproducible pain on palpation medial calcaneal tubercle of the right heel.  No pain on palpation of the sinus tarsi.  Assessment: Planter fasciitis cannot rule out some some residual gout to the great toe particularly some stiffness there.  Plan: Discussed etiology pathology and surgical therapies at this poin I offered him injection he declined.  Started him on indomethacin 50 mg 1 p.o. twice daily he will notify us with any questions or concerns or stomach upset.  He will discontinue medications if any of that happens.  I highly recommend should he develop an acute pain attack that he come and receive a prescription requisition for an arthritic profile including ANA uric acid sed rate.

## 2021-10-06 ENCOUNTER — Ambulatory Visit: Payer: Self-pay | Admitting: Podiatry

## 2021-12-01 ENCOUNTER — Ambulatory Visit: Payer: Self-pay | Admitting: Family

## 2022-02-07 ENCOUNTER — Encounter (INDEPENDENT_AMBULATORY_CARE_PROVIDER_SITE_OTHER): Payer: Self-pay | Admitting: Podiatry

## 2022-02-07 NOTE — Progress Notes (Signed)
This encounter was created in error - please disregard.

## 2022-04-11 IMAGING — CT CT RENAL STONE PROTOCOL
2 of 4 series · 17 of 46 positions shown, 19 images · non-contrast
Comparison: None.

CLINICAL DATA: Flank pain.  Evaluate for kidney stone.

EXAM:
CT ABDOMEN AND PELVIS WITHOUT CONTRAST
TECHNIQUE: Multidetector CT imaging of the abdomen and pelvis was performed
following the standard protocol without IV contrast.

[Series 3: renal stone 5.0 · axial · 0.86mm/px · z∈[+738,+1218]mm · 14 of 106 slices shown, 16 images]
[im 5/106  soft-tissue]
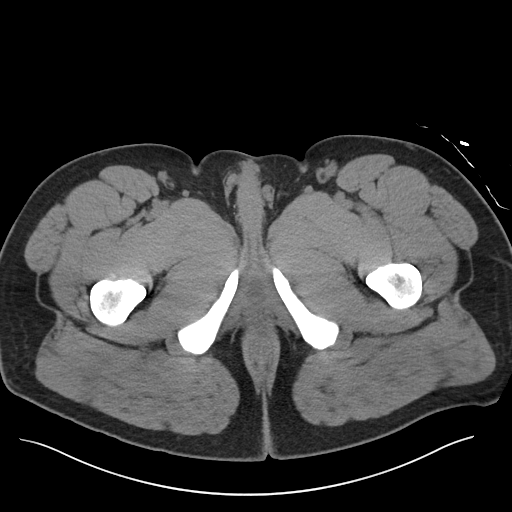
[im 5/106  bone]
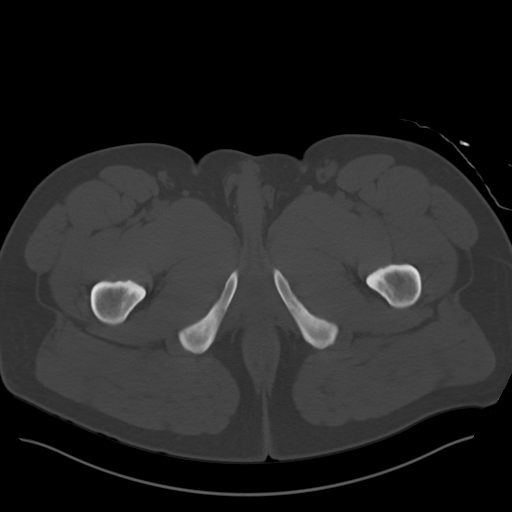
[im 14/106  soft-tissue]
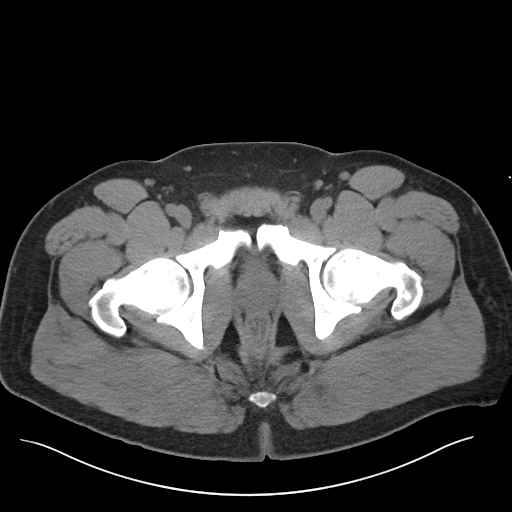
[im 22/106  soft-tissue]
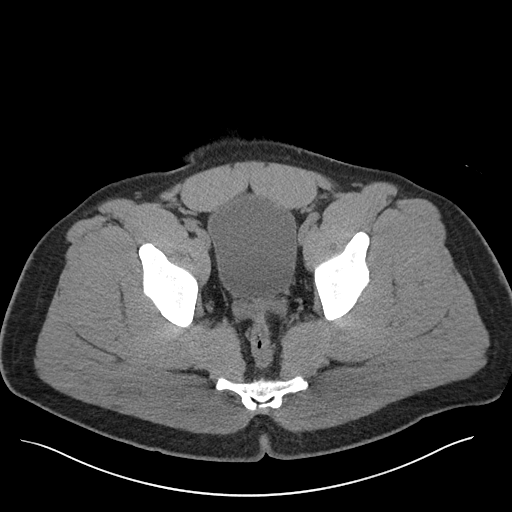
[im 27/106  soft-tissue]
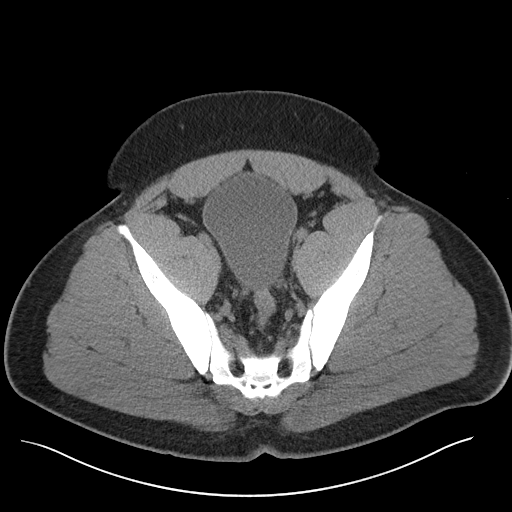
[im 36/106  soft-tissue]
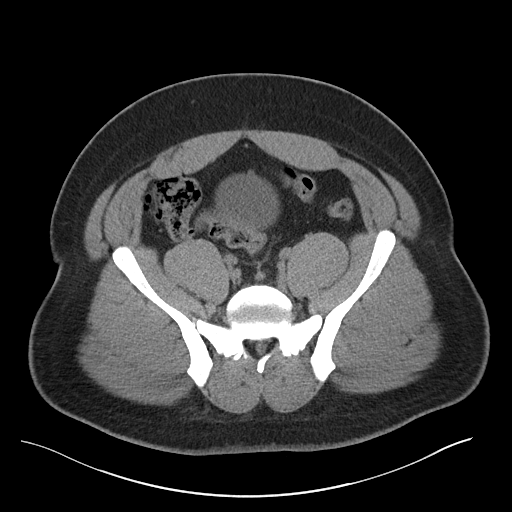
[im 44/106  soft-tissue]
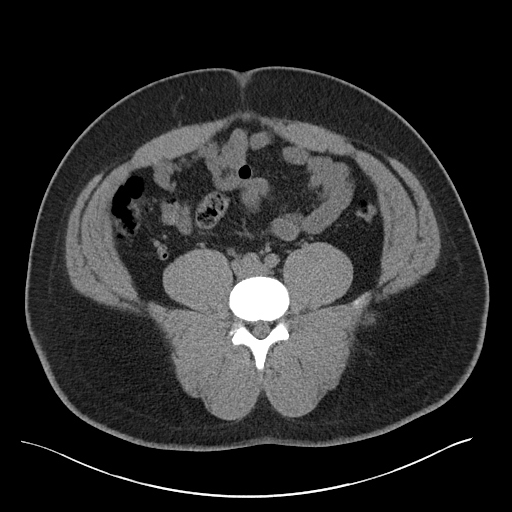
[im 49/106  soft-tissue]
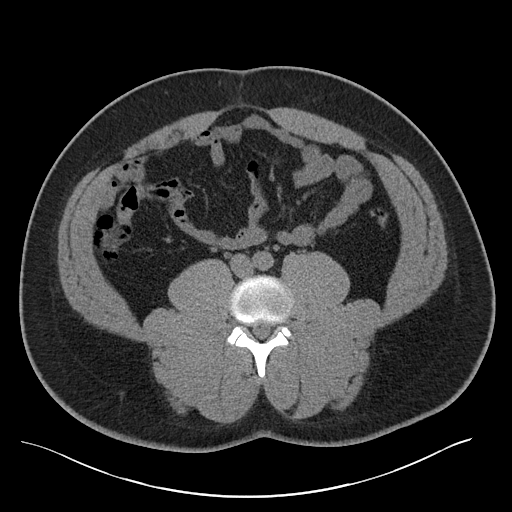
[im 57/106  soft-tissue]
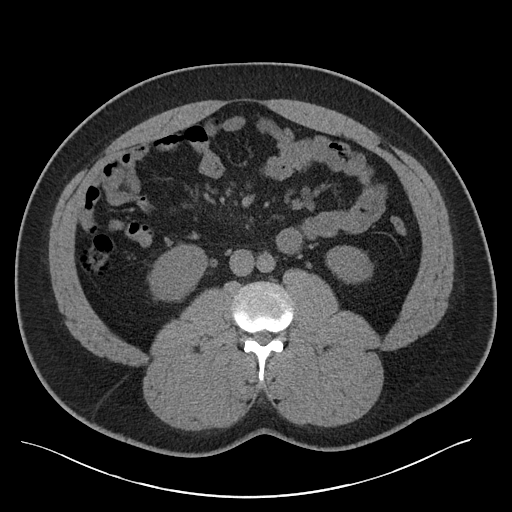
[im 62/106  soft-tissue]
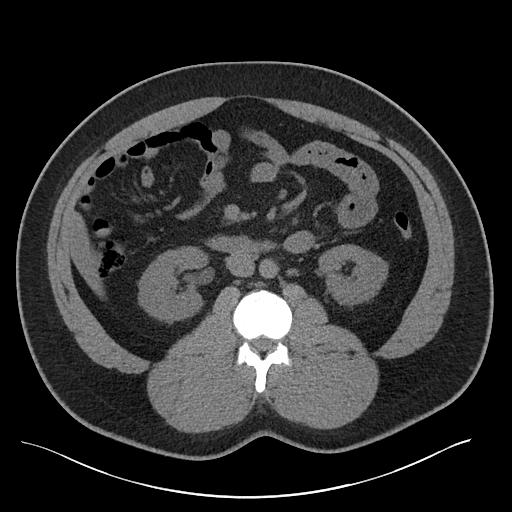
[im 62/106  bone]
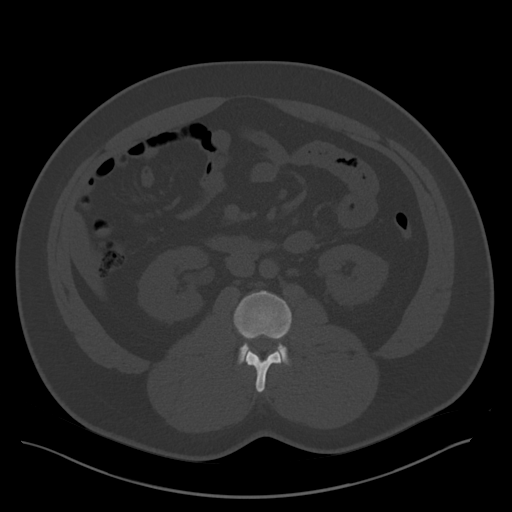
[im 71/106  soft-tissue]
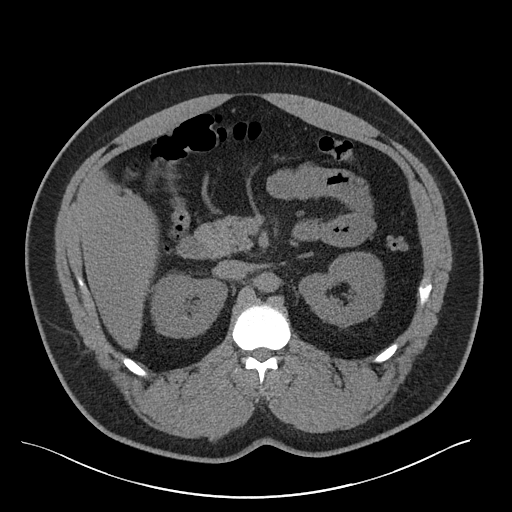
[im 79/106  soft-tissue]
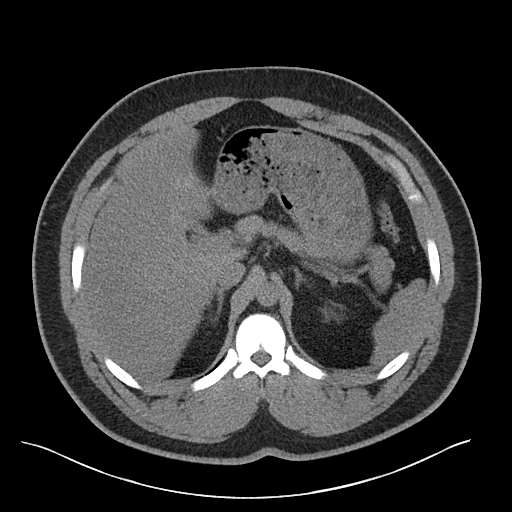
[im 84/106  soft-tissue]
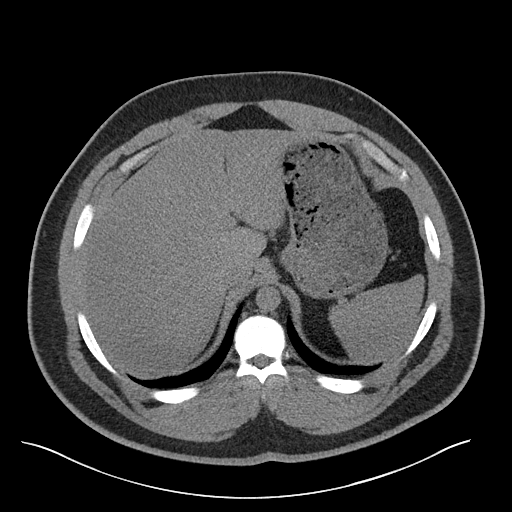
[im 92/106  soft-tissue]
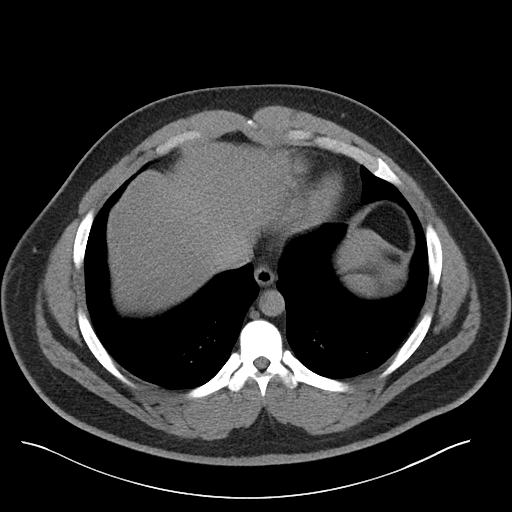
[im 101/106  soft-tissue]
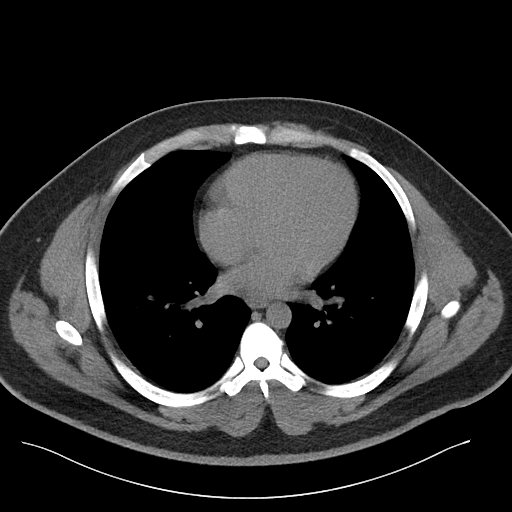

[Series 6: coronal · coronal · 0.99mm/px · 3 of 114 slices shown]
[im 38/114  soft-tissue]
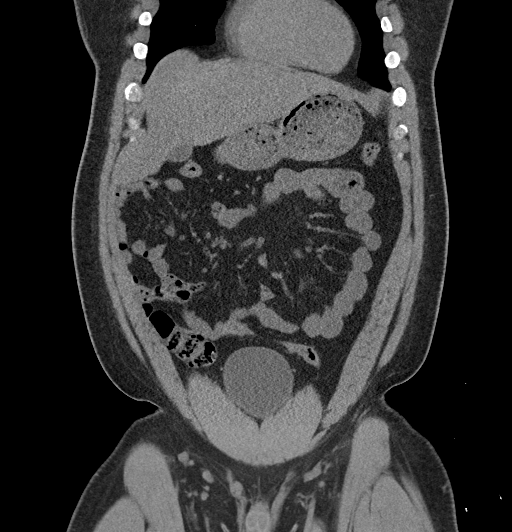
[im 51/114  soft-tissue]
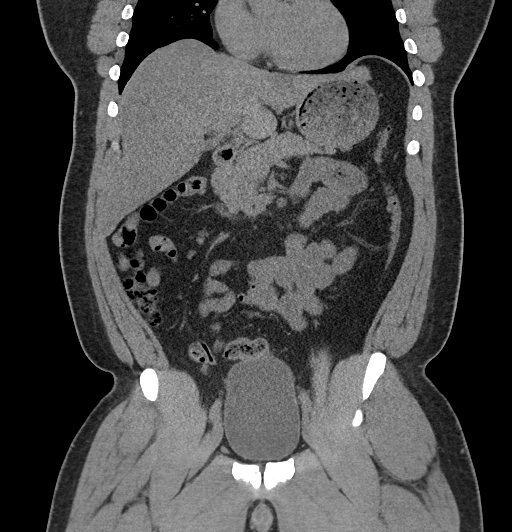
[im 63/114  soft-tissue]
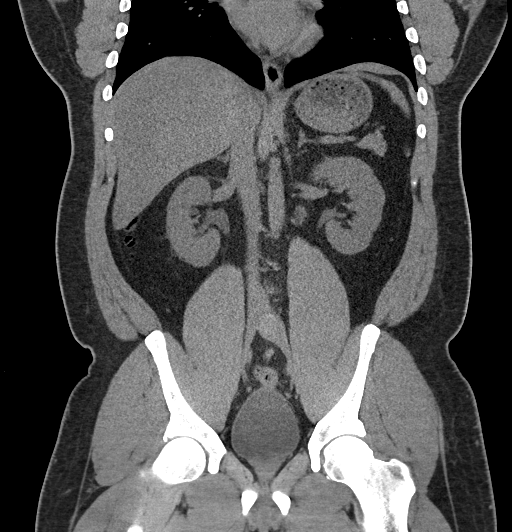

[17 of 46 positions shown; findings below may reference images not displayed]

FINDINGS: Lower chest: No acute abnormality.

Hepatobiliary: No focal liver abnormality is seen. No gallstones,
gallbladder wall thickening, or biliary dilatation.

Pancreas: Unremarkable. No pancreatic ductal dilatation or
surrounding inflammatory changes.

Spleen: Normal in size without focal abnormality.

Adrenals/Urinary Tract: Normal adrenal glands. Mild punctate stone
within upper pole of right kidney is identified, image 73/6. No left
renal calculi. Mild right hydroureter. No right ureteral calculi.
Punctate stone is identified within the posterior bladder base
measuring 2-3 mm, image 66/7.

Stomach/Bowel: Stomach appears normal. The appendix is visualized
and appears normal. No bowel wall thickening, inflammation or
distension.

Vascular/Lymphatic: No significant vascular findings are present. No
enlarged abdominal or pelvic lymph nodes.

Reproductive: Prostate is unremarkable.

Other: No abdominal wall hernia or abnormality. No abdominopelvic
ascites.

Musculoskeletal: No acute or significant osseous findings. Bilateral
L5 pars defects. No significant anterolisthesis.
IMPRESSION: 1. Mild right hydroureter. No ureteral calculi identified. Punctate
stone is within the posterior bladder base measures 2-3 mm.
